# Patient Record
Sex: Male | Born: 1975 | Race: White | Hispanic: No | Marital: Married | Smoking: Never smoker
Health system: Southern US, Community
[De-identification: ages and names within clinical notes are randomized; demographics above are authoritative.]

## PROBLEM LIST (undated history)

## (undated) DIAGNOSIS — F32A Depression, unspecified: Secondary | ICD-10-CM

## (undated) DIAGNOSIS — G43909 Migraine, unspecified, not intractable, without status migrainosus: Secondary | ICD-10-CM

## (undated) DIAGNOSIS — F329 Major depressive disorder, single episode, unspecified: Secondary | ICD-10-CM

## (undated) DIAGNOSIS — G4730 Sleep apnea, unspecified: Secondary | ICD-10-CM

## (undated) HISTORY — DX: Depression, unspecified: F32.A

## (undated) HISTORY — DX: Sleep apnea, unspecified: G47.30

## (undated) HISTORY — DX: Major depressive disorder, single episode, unspecified: F32.9

## (undated) HISTORY — PX: TONSILLECTOMY: SUR1361

## (undated) HISTORY — DX: Migraine, unspecified, not intractable, without status migrainosus: G43.909

---

## 2014-10-28 ENCOUNTER — Ambulatory Visit (INDEPENDENT_AMBULATORY_CARE_PROVIDER_SITE_OTHER): Payer: Federal, State, Local not specified - PPO | Admitting: Emergency Medicine

## 2014-10-28 VITALS — BP 112/68 | HR 69 | Temp 98.4°F | Resp 18 | Ht 67.0 in | Wt 194.0 lb

## 2014-10-28 DIAGNOSIS — J029 Acute pharyngitis, unspecified: Secondary | ICD-10-CM | POA: Diagnosis not present

## 2014-10-28 MED ORDER — CEFACLOR 250 MG PO CAPS: 250.0000 mg | ORAL_CAPSULE | Freq: Three times a day (TID) | ORAL | Status: DC

## 2014-10-28 NOTE — Progress Notes (Signed)
Subjective:  Patient ID: Christian [name], male    DOB: 12-26-1975  Age: 39 y.o. MRN: [id number]  CC: Sore Throat and Chills   HPI Christian [name] presents  he has urgently clear. He has no fever or chills. He has no wheezing or shortness breath has no cough nausea vomiting or rash. No improvement with over-the-counter medication  History Basir has a past medical history of Depression.   He has no past surgical history on file.   His  family history includes Cancer in his maternal grandmother; Heart disease in his maternal grandfather, paternal grandfather, and paternal grandmother; Mental illness in his maternal grandmother.  He   reports that he has never smoked. He does not have any smokeless tobacco history on file. He reports that he does not drink alcohol or use illicit drugs.  No outpatient prescriptions prior to visit.   No facility-administered medications prior to visit.    Social History   Social History  . Marital Status: Married    Spouse Name: N/A  . Number of Children: N/A  . Years of Education: N/A   Social History Main Topics  . Smoking status: Never Smoker   . Smokeless tobacco: None  . Alcohol Use: No  . Drug Use: No  . Sexual Activity: Not Asked   Other Topics Concern  . None   Social History Narrative  . None     Review of Systems  Constitutional: Positive for chills. Negative for fever and appetite change.  HENT: Positive for congestion and sore throat. Negative for ear pain, postnasal drip and sinus pressure.   Eyes: Negative for pain and redness.  Respiratory: Negative for cough, shortness of breath and wheezing.   Cardiovascular: Negative for leg swelling.  Gastrointestinal: Negative for nausea, vomiting, abdominal pain, diarrhea, constipation and blood in stool.  Endocrine: Negative for polyuria.  Genitourinary: Negative for dysuria, urgency, frequency and flank pain.  Musculoskeletal: Negative for gait problem.  Skin: Negative for  rash.  Neurological: Negative for weakness and headaches.  Psychiatric/Behavioral: Negative for confusion and decreased concentration. The patient is not nervous/anxious.     Objective:  BP 112/68 mmHg  Pulse 69  Temp(Src) 98.4 F (36.9 C) (Oral)  Resp 18  Ht  (1.702 m)  Wt 194 lb (87.998 kg)  BMI 30.38 kg/m2  SpO2 98%  Physical Exam  Constitutional: He is oriented to person, place, and time. He appears well-developed and well-nourished. No distress.  HENT:  Head: Normocephalic and atraumatic.  Right Ear: External ear normal.  Left Ear: External ear normal.  Nose: Nose normal.  Mouth/Throat: Posterior oropharyngeal edema and posterior oropharyngeal erythema present. No oropharyngeal exudate or tonsillar abscesses.  Eyes: Conjunctivae and EOM are normal. Pupils are equal, round, and reactive to light. No scleral icterus.  Neck: Normal range of motion. Neck supple. No tracheal deviation present.  Cardiovascular: Normal rate, regular rhythm and normal heart sounds.   Pulmonary/Chest: Effort normal. No respiratory distress. He has no wheezes. He has no rales.  Abdominal: He exhibits no mass. There is no tenderness. There is no rebound and no guarding.  Musculoskeletal: He exhibits no edema.  Lymphadenopathy:    He has no cervical adenopathy.  Neurological: He is alert and oriented to person, place, and time. Coordination normal.  Skin: Skin is warm and dry. No rash noted.  Psychiatric: He has a normal mood and affect. His behavior is normal.      Assessment & Plan:   Mohsin was seen today  for sore throat and chills.  Diagnoses and all orders for this visit:  Acute pharyngitis, unspecified etiology  Other orders -     cefaclor (CECLOR) 250 MG capsule; Take 1 capsule (250 mg total) by mouth 3 (three) times daily.  I am having [name] start on cefaclor. I am also having him maintain his buPROPion, PARoxetine, and prazosin.  Meds ordered this encounter    Medications  . buPROPion (WELLBUTRIN SR) 150 MG 12 hr tablet    Sig: Take 150 mg by mouth 2 (two) times daily.  Marland Kitchen PARoxetine (PAXIL) 40 MG tablet    Sig: Take 40 mg by mouth every morning.  . prazosin (MINIPRESS) 1 MG capsule    Sig: Take 1 mg by mouth at bedtime.  . cefaclor (CECLOR) 250 MG capsule    Sig: Take 1 capsule (250 mg total) by mouth 3 (three) times daily.    Dispense:  20 capsule    Refill:  0    Appropriate red flag conditions were discussed with the patient as well as actions that should be taken.  Patient expressed his understanding.  Follow-up: Return if symptoms worsen or fail to improve.  Carmelina Dane, MD

## 2014-10-28 NOTE — Patient Instructions (Signed)

## 2015-03-11 DIAGNOSIS — F32A Depression, unspecified: Secondary | ICD-10-CM | POA: Insufficient documentation

## 2015-03-11 DIAGNOSIS — F329 Major depressive disorder, single episode, unspecified: Secondary | ICD-10-CM | POA: Insufficient documentation

## 2016-01-06 DIAGNOSIS — Z880 Allergy status to penicillin: Secondary | ICD-10-CM | POA: Diagnosis not present

## 2016-01-06 DIAGNOSIS — Z79899 Other long term (current) drug therapy: Secondary | ICD-10-CM | POA: Diagnosis not present

## 2016-01-06 DIAGNOSIS — R0602 Shortness of breath: Secondary | ICD-10-CM | POA: Diagnosis not present

## 2016-08-17 DIAGNOSIS — G4733 Obstructive sleep apnea (adult) (pediatric): Secondary | ICD-10-CM | POA: Diagnosis not present

## 2017-03-25 ENCOUNTER — Encounter: Payer: Self-pay | Admitting: Physician Assistant

## 2017-03-25 ENCOUNTER — Ambulatory Visit: Payer: Federal, State, Local not specified - PPO | Admitting: Physician Assistant

## 2017-03-25 ENCOUNTER — Other Ambulatory Visit: Payer: Self-pay

## 2017-03-25 VITALS — BP 102/70 | HR 71 | Temp 98.1°F | Resp 18 | Ht 67.0 in | Wt 185.0 lb

## 2017-03-25 DIAGNOSIS — Z1329 Encounter for screening for other suspected endocrine disorder: Secondary | ICD-10-CM

## 2017-03-25 DIAGNOSIS — Z130 Encounter for screening for diseases of the blood and blood-forming organs and certain disorders involving the immune mechanism: Secondary | ICD-10-CM

## 2017-03-25 DIAGNOSIS — Z1321 Encounter for screening for nutritional disorder: Secondary | ICD-10-CM

## 2017-03-25 DIAGNOSIS — Z113 Encounter for screening for infections with a predominantly sexual mode of transmission: Secondary | ICD-10-CM | POA: Diagnosis not present

## 2017-03-25 DIAGNOSIS — Z0000 Encounter for general adult medical examination without abnormal findings: Secondary | ICD-10-CM

## 2017-03-25 DIAGNOSIS — Z114 Encounter for screening for human immunodeficiency virus [HIV]: Secondary | ICD-10-CM

## 2017-03-25 DIAGNOSIS — Z13228 Encounter for screening for other metabolic disorders: Secondary | ICD-10-CM

## 2017-03-25 NOTE — Patient Instructions (Signed)
     IF you received an x-ray today, you will receive an invoice from [facility]. Please contact  Radiology at [phone] with questions or concerns regarding your invoice.   IF you received labwork today, you will receive an invoice from [facility]. Please contact [facility] at [phone] with questions or concerns regarding your invoice.   Our billing staff will not be able to assist you with questions regarding bills from these companies.  You will be contacted with the lab results as soon as they are available. The fastest way to get your results is to activate your My Chart account. Instructions are located on the last page of this paperwork. If you have not heard from us regarding the results in 2 weeks, please contact this office.     

## 2017-03-25 NOTE — Progress Notes (Signed)
03/25/2017 12:08 PM   DOB: February 06, 1975 / MRN: [id number]  SUBJECTIVE:  Christian [name] is a 42 y.o. male presenting for annual exam. He denies a family history of prostate cancer, colon cancer, sudden cardiac death.  He does have a family history of CAD but no early death. He has not been screened diabetes. He is never smoker and denies marijuana.  He does not drink.  He has a history of PTSD well controlled by by the psychiatry in the [place].     He is allergic to penicillins.   He  has a past medical history of Depression.    He  reports that  has never smoked. His smokeless tobacco use includes snuff. He reports that he does not drink alcohol or use drugs. He  has no sexual activity history on file. The patient  has no past surgical history on file.  His family history includes Cancer in his maternal grandmother; Heart disease in his maternal grandfather, paternal grandfather, and paternal grandmother; Mental illness in his maternal grandmother.  Review of Systems  Constitutional: Negative for chills, diaphoresis and fever.  Eyes: Negative.   Respiratory: Negative for cough, hemoptysis, sputum production, shortness of breath and wheezing.   Cardiovascular: Negative for chest pain, orthopnea and leg swelling.  Gastrointestinal: Negative for abdominal pain, blood in stool, constipation, diarrhea, heartburn, melena, nausea and vomiting.  Genitourinary: Negative for dysuria, flank pain, frequency, hematuria and urgency.  Skin: Negative for rash.  Neurological: Negative for dizziness, sensory change, speech change, focal weakness and headaches.    The problem list and medications were reviewed and updated by myself where necessary and exist elsewhere in the encounter.   OBJECTIVE:  BP 102/70   Pulse 71   Temp 98.1 F (36.7 C) (Oral)   Resp 18   Ht 5\' 7"  (1.702 m)   Wt 185 lb (83.9 kg)   SpO2 98%   BMI 28.98 kg/m   Physical Exam  Constitutional: He appears well-developed. He is  active and cooperative.  Non-toxic appearance.  HENT:  Right Ear: External ear normal.  Left Ear: External ear normal.  Mouth/Throat: Oropharynx is clear and moist. No oropharyngeal exudate.  Cardiovascular: Normal rate, regular rhythm, S1 normal, S2 normal, normal heart sounds, intact distal pulses and normal pulses. Exam reveals no gallop and no friction rub.  No murmur heard. Pulmonary/Chest: Effort normal. No stridor. No tachypnea. No respiratory distress. He has no wheezes. He has no rales.  Abdominal: He exhibits no distension.  Musculoskeletal: He exhibits no edema.  Neurological: He is alert.  Skin: Skin is warm and dry. He is not diaphoretic. No pallor.  Vitals reviewed.   No results found for this or any previous visit (from the past 72 hour(s)).  No results found.  ASSESSMENT AND PLAN:  Christian [name] was seen today for lab work.  Diagnoses and all orders for this visit:  Annual physical exam: 42 year old male with a long history of dipping here today for annual exam.  Screenings out.  His exam is normal.  No concerning lesions in the mouth.  Screening for HIV (human immunodeficiency virus) -     HIV antibody  Screening examination for STD (sexually transmitted disease) -     GC/Chlamydia Probe Amp  Screening for endocrine, nutritional, metabolic and immunity disorder -     CBC -     Lipid panel -     TSH -     Hemoglobin A1c -     Hepatitis C  antibody -     Hepatitis B surface antibody -     Hepatitis B surface antigen -     Basic metabolic panel -     Hepatic function panel    The patient is advised to call or return to clinic if he does not see an improvement in symptoms, or to seek the care of the closest emergency department if he worsens with the above plan.   Deliah BostonMichael Lior [name], MHS, PA-C [facility] 03/25/2017 12:08 PM

## 2017-03-26 LAB — CBC
HEMOGLOBIN: 15.7 g/dL (ref 13.0–17.7)
Hematocrit: 44.7 % (ref 37.5–51.0)
MCH: 30 pg (ref 26.6–33.0)
MCHC: 35.1 g/dL (ref 31.5–35.7)
MCV: 85 fL (ref 79–97)
PLATELETS: 209 10*3/uL (ref 150–379)
RBC: 5.24 x10E6/uL (ref 4.14–5.80)
RDW: 13.9 % (ref 12.3–15.4)
WBC: 6.5 10*3/uL (ref 3.4–10.8)

## 2017-03-26 LAB — LIPID PANEL
CHOLESTEROL TOTAL: 210 mg/dL — AB (ref 100–199)
Chol/HDL Ratio: 5.7 ratio — ABNORMAL HIGH (ref 0.0–5.0)
HDL: 37 mg/dL — ABNORMAL LOW (ref >39)
LDL CALC: 121 mg/dL — AB (ref 0–99)
TRIGLYCERIDES: 262 mg/dL — AB (ref 0–149)
VLDL Cholesterol Cal: 52 mg/dL — ABNORMAL HIGH (ref 5–40)

## 2017-03-26 LAB — BASIC METABOLIC PANEL
BUN/Creatinine Ratio: 9 (ref 9–20)
BUN: 10 mg/dL (ref 6–20)
CO2: 26 mmol/L (ref 20–29)
CREATININE: 1.06 mg/dL (ref 0.76–1.27)
Calcium: 10.4 mg/dL — ABNORMAL HIGH (ref 8.7–10.2)
Chloride: 100 mmol/L (ref 96–106)
GFR, EST AFRICAN AMERICAN: 102 mL/min/{1.73_m2} (ref >59)
GFR, EST NON AFRICAN AMERICAN: 88 mL/min/{1.73_m2} (ref >59)
Glucose: 86 mg/dL (ref 65–99)
Potassium: 5 mmol/L (ref 3.5–5.2)
Sodium: 140 mmol/L (ref 134–144)

## 2017-03-26 LAB — HEPATIC FUNCTION PANEL
ALK PHOS: 75 IU/L (ref 39–117)
ALT: 23 IU/L (ref 0–44)
AST: 19 IU/L (ref 0–40)
Albumin: 5.1 g/dL (ref 3.5–5.5)
BILIRUBIN, DIRECT: 0.1 mg/dL (ref 0.00–0.40)
Bilirubin Total: 0.4 mg/dL (ref 0.0–1.2)
Total Protein: 7.4 g/dL (ref 6.0–8.5)

## 2017-03-26 LAB — HEPATITIS B SURFACE ANTIGEN: HEP B S AG: NEGATIVE

## 2017-03-26 LAB — HEPATITIS B SURFACE ANTIBODY, QUANTITATIVE: Hepatitis B Surf Ab Quant: 4.6 m[IU]/mL — ABNORMAL LOW

## 2017-03-26 LAB — LDL CHOLESTEROL, DIRECT: LDL Direct: 146 mg/dL — ABNORMAL HIGH (ref 0–99)

## 2017-03-26 LAB — TSH: TSH: 1.51 u[IU]/mL (ref 0.450–4.500)

## 2017-03-26 LAB — HIV ANTIBODY (ROUTINE TESTING W REFLEX): HIV SCREEN 4TH GENERATION: NONREACTIVE

## 2017-03-26 LAB — HEMOGLOBIN A1C
ESTIMATED AVERAGE GLUCOSE: 97 mg/dL
HEMOGLOBIN A1C: 5 % (ref 4.8–5.6)

## 2017-03-26 LAB — HEPATITIS C ANTIBODY: Hep C Virus Ab: <0.1 {s_co_ratio} (ref 0.0–0.9)

## 2017-03-28 LAB — GC/CHLAMYDIA PROBE AMP
CHLAMYDIA, DNA PROBE: NEGATIVE
NEISSERIA GONORRHOEAE BY PCR: NEGATIVE

## 2017-08-05 ENCOUNTER — Encounter: Payer: Self-pay | Admitting: Emergency Medicine

## 2017-08-05 ENCOUNTER — Other Ambulatory Visit: Payer: Self-pay

## 2017-08-05 ENCOUNTER — Ambulatory Visit: Payer: Federal, State, Local not specified - PPO | Admitting: Emergency Medicine

## 2017-08-05 VITALS — BP 112/77 | HR 67 | Temp 98.3°F | Resp 16 | Ht 67.0 in | Wt 184.4 lb

## 2017-08-05 DIAGNOSIS — M5416 Radiculopathy, lumbar region: Secondary | ICD-10-CM

## 2017-08-05 MED ORDER — PREDNISONE 20 MG PO TABS: 40.0000 mg | ORAL_TABLET | Freq: Every day | ORAL | 0 refills | Status: AC

## 2017-08-05 NOTE — Progress Notes (Signed)
Christian [name] 42 y.o.   Chief Complaint  Patient presents with  . back pain radiating into left thigh since tuesday 08/01/17    pain in left side intermittent, burning sensation in right groin area and top of thigh, pain level 8/10. ,motrin for pain but no motrin today.  Per pt the burning sensation is so bad it hurts for clothes to touch the skin.  No c/o dysuria    HISTORY OF PRESENT ILLNESS: This is a 42 y.o. male complaining of burning pain to his right thigh area that starts in the right lumbar area that started several days ago.  States skin is very sensitive.  Denies a rash.  Denies injury although he was installing car stereo recently and thinks he strained his low back.  Denies bladder or bowel symptoms.  Fully ambulatory without difficulty.  No other significant symptoms.  HPI   Prior to Admission medications   Medication Sig Start Date End Date Taking? Authorizing Provider  buPROPion (WELLBUTRIN SR) 150 MG 12 hr tablet Take 150 mg by mouth 2 (two) times daily.   Yes [provider]  PARoxetine (PAXIL) 40 MG tablet Take 40 mg by mouth every morning.   Yes [provider]  prazosin (MINIPRESS) 1 MG capsule Take 1 mg by mouth at bedtime.   Yes [provider]  predniSONE (DELTASONE) 20 MG tablet Take 2 tablets (40 mg total) by mouth daily with breakfast for 5 days. 08/05/17 08/10/17  Georgina Quint, MD    Allergies  Allergen Reactions  . Penicillins Hives and Shortness Of Breath    There are no active problems to display for this patient.   Past Medical History:  Diagnosis Date  . Depression     No past surgical history on file.  Social History   Socioeconomic History  . Marital status: Married    Spouse name: Not on file  . Number of children: Not on file  . Years of education: Not on file  . Highest education level: Not on file  Occupational History  . Not on file  Social Needs  . Financial resource strain: Not on file  . Food  insecurity:    Worry: Not on file    Inability: Not on file  . Transportation needs:    Medical: Not on file    Non-medical: Not on file  Tobacco Use  . Smoking status: Never Smoker  . Smokeless tobacco: Current User    Types: Snuff  Substance and Sexual Activity  . Alcohol use: No    Alcohol/week: 0.0 oz  . Drug use: No  . Sexual activity: Not on file  Lifestyle  . Physical activity:    Days per week: Not on file    Minutes per session: Not on file  . Stress: Not on file  Relationships  . Social connections:    Talks on phone: Not on file    Gets together: Not on file    Attends religious service: Not on file    Active member of club or organization: Not on file    Attends meetings of clubs or organizations: Not on file    Relationship status: Not on file  . Intimate partner violence:    Fear of current or ex partner: Not on file    Emotionally abused: Not on file    Physically abused: Not on file    Forced sexual activity: Not on file  Other Topics Concern  . Not on file  Social History Narrative  . Not on file    Family History  Problem Relation Age of Onset  . Cancer Maternal Grandmother   . Mental illness Maternal Grandmother   . Heart disease Maternal Grandfather   . Heart disease Paternal Grandmother   . Heart disease Paternal Grandfather      Review of Systems  Constitutional: Negative.  Negative for chills, fever and malaise/fatigue.  HENT: Negative.  Negative for congestion, sinus pain and sore throat.   Eyes: Negative.   Respiratory: Negative.  Negative for cough and shortness of breath.   Cardiovascular: Negative.  Negative for chest pain and palpitations.  Gastrointestinal: Negative.  Negative for abdominal pain, blood in stool, diarrhea, nausea and vomiting.  Genitourinary: Negative.  Negative for dysuria and hematuria.  Musculoskeletal: Positive for back pain.  Skin: Negative.  Negative for rash.  Neurological: Negative for dizziness, sensory  change, focal weakness and headaches.  Endo/Heme/Allergies: Negative.   All other systems reviewed and are negative.   Vitals:   08/05/17 1049  BP: 112/77  Pulse: 67  Resp: 16  Temp: 98.3 F (36.8 C)  SpO2: 98%    Physical Exam  Constitutional: He is oriented to person, place, and time. He appears well-developed and well-nourished.  HENT:  Head: Normocephalic and atraumatic.  Eyes: Pupils are equal, round, and reactive to light. EOM are normal.  Neck: Normal range of motion. Neck supple.  Cardiovascular: Normal rate, regular rhythm and normal heart sounds.  Pulmonary/Chest: Effort normal and breath sounds normal.  Abdominal: Soft. Bowel sounds are normal. He exhibits no distension. There is no tenderness.  Musculoskeletal: He exhibits no edema or tenderness.       Lumbar back: He exhibits decreased range of motion. He exhibits no bony tenderness, no pain, no spasm and normal pulse.       Back:  Lower extremities: Within normal limits.  Normal sensation and normal strength.  NVI.  Neurological: He is alert and oriented to person, place, and time. He displays normal reflexes. No sensory deficit. He exhibits normal muscle tone. Coordination normal.  Skin: Skin is warm and dry. Capillary refill takes less than 2 seconds. No rash noted.  Psychiatric: He has a normal mood and affect. His behavior is normal.  Vitals reviewed.  A total of 25 minutes was spent in the room with the patient, greater than 50% of which was in counseling/coordination of care regarding differential diagnosis, treatment, need for diagnostic imaging and possible orthopedic follow-up.   ASSESSMENT & PLAN: Christian [name] was seen today for back pain radiating into left thigh since tuesday 08/01/17.  Diagnoses and all orders for this visit:  Lumbar back pain with radiculopathy affecting right lower extremity -     MR Lumbar Spine Wo Contrast; Future -     predniSONE (DELTASONE) 20 MG tablet; Take 2 tablets (40 mg  total) by mouth daily with breakfast for 5 days. -     Ambulatory referral to Orthopedic Surgery    Patient Instructions       IF you received an x-ray today, you will receive an invoice from [facility]. Please contact [facility] at [phone] with questions or concerns regarding your invoice.   IF you received labwork today, you will receive an invoice from [facility]. Please contact [facility] at [phone] with questions or concerns regarding your invoice.   Our billing staff will not be able to assist you with questions regarding bills from these companies.  You will be contacted with the lab  results as soon as they are available. The fastest way to get your results is to activate your My Chart account. Instructions are located on the last page of this paperwork. If you have not heard from us regarding the results in 2 weeks, please contact this office.     Sciatica Sciatica is pain, numbness, weakness, or tingling along your sciatic nerve. The sciatic nerve starts in the lower back and goes down the back of each leg. Sciatica happens when this nerve is pinched or has pressure put on it. Sciatica usually goes away on its own or with treatment. Sometimes, sciatica may keep coming back (recur). Follow these instructions at home: Medicines  Take over-the-counter and prescription medicines only as told by your doctor.  Do not drive or use heavy machinery while taking prescription pain medicine. Managing pain  If directed, put ice on the affected area. ? Put ice in a plastic bag. ? Place a towel between your skin and the bag. ? Leave the ice on for 20 minutes, 2-3 times a day.  After icing, apply heat to the affected area before you exercise or as often as told by your doctor. Use the heat source that your doctor tells you to use, such as a moist heat pack or a heating pad. ? Place a towel between your skin and the heat source. ? Leave the heat on for 20-30  minutes. ? Remove the heat if your skin turns bright red. This is especially important if you are unable to feel pain, heat, or cold. You may have a greater risk of getting burned. Activity  Return to your normal activities as told by your doctor. Ask your doctor what activities are safe for you. ? Avoid activities that make your sciatica worse.  Take short rests during the day. Rest in a lying or standing position. This is usually better than sitting to rest. ? When you rest for a long time, do some physical activity or stretching between periods of rest. ? Avoid sitting for a long time without moving. Get up and move around at least one time each hour.  Exercise and stretch regularly, as told by your doctor.  Do not lift anything that is heavier than 10 lb (4.5 kg) while you have symptoms of sciatica. ? Avoid lifting heavy things even when you do not have symptoms. ? Avoid lifting heavy things over and over.  When you lift objects, always lift in a way that is safe for your body. To do this, you should: ? Bend your knees. ? Keep the object close to your body. ? Avoid twisting. General instructions  Use good posture. ? Avoid leaning forward when you are sitting. ? Avoid hunching over when you are standing.  Stay at a healthy weight.  Wear comfortable shoes that support your feet. Avoid wearing high heels.  Avoid sleeping on a mattress that is too soft or too hard. You might have less pain if you sleep on a mattress that is firm enough to support your back.  Keep all follow-up visits as told by your doctor. This is important. Contact a doctor if:  You have pain that: ? Wakes you up when you are sleeping. ? Gets worse when you lie down. ? Is worse than the pain you have had in the past. ? Lasts longer than 4 weeks.  You lose weight for without trying. Get help right away if:  You cannot control when you pee (urinate) or poop (have a  bowel movement).  You have weakness in  any of these areas and it gets worse. ? Lower back. ? Lower belly (pelvis). ? Butt (buttocks). ? Legs.  You have redness or swelling of your back.  You have a burning feeling when you pee. This information is not intended to replace advice given to you by your health care provider. Make sure you discuss any questions you have with your health care provider. Document Released: 10/20/2007 Document Revised: 06/18/2015 Document Reviewed: 09/19/2014 Elsevier Interactive Patient Education  2018 Elsevier Inc.      Edwina Barth, MD [facility]

## 2017-08-05 NOTE — Patient Instructions (Addendum)
   IF you received an x-ray today, you will receive an invoice from [facility]. Please contact [facility] at [phone] with questions or concerns regarding your invoice.   IF you received labwork today, you will receive an invoice from [facility]. Please contact [facility] at [phone] with questions or concerns regarding your invoice.   Our billing staff will not be able to assist you with questions regarding bills from these companies.  You will be contacted with the lab results as soon as they are available. The fastest way to get your results is to activate your My Chart account. Instructions are located on the last page of this paperwork. If you have not heard from us regarding the results in 2 weeks, please contact this office.     Sciatica Sciatica is pain, numbness, weakness, or tingling along your sciatic nerve. The sciatic nerve starts in the lower back and goes down the back of each leg. Sciatica happens when this nerve is pinched or has pressure put on it. Sciatica usually goes away on its own or with treatment. Sometimes, sciatica may keep coming back (recur). Follow these instructions at home: Medicines  Take over-the-counter and prescription medicines only as told by your doctor.  Do not drive or use heavy machinery while taking prescription pain medicine. Managing pain  If directed, put ice on the affected area. ? Put ice in a plastic bag. ? Place a towel between your skin and the bag. ? Leave the ice on for 20 minutes, 2-3 times a day.  After icing, apply heat to the affected area before you exercise or as often as told by your doctor. Use the heat source that your doctor tells you to use, such as a moist heat pack or a heating pad. ? Place a towel between your skin and the heat source. ? Leave the heat on for 20-30 minutes. ? Remove the heat if your skin turns bright red. This is especially important if you are unable to feel pain, heat, or  cold. You may have a greater risk of getting burned. Activity  Return to your normal activities as told by your doctor. Ask your doctor what activities are safe for you. ? Avoid activities that make your sciatica worse.  Take short rests during the day. Rest in a lying or standing position. This is usually better than sitting to rest. ? When you rest for a long time, do some physical activity or stretching between periods of rest. ? Avoid sitting for a long time without moving. Get up and move around at least one time each hour.  Exercise and stretch regularly, as told by your doctor.  Do not lift anything that is heavier than 10 lb (4.5 kg) while you have symptoms of sciatica. ? Avoid lifting heavy things even when you do not have symptoms. ? Avoid lifting heavy things over and over.  When you lift objects, always lift in a way that is safe for your body. To do this, you should: ? Bend your knees. ? Keep the object close to your body. ? Avoid twisting. General instructions  Use good posture. ? Avoid leaning forward when you are sitting. ? Avoid hunching over when you are standing.  Stay at a healthy weight.  Wear comfortable shoes that support your feet. Avoid wearing high heels.  Avoid sleeping on a mattress that is too soft or too hard. You might have less pain if you sleep on a mattress that is firm enough   to support your back.  Keep all follow-up visits as told by your doctor. This is important. Contact a doctor if:  You have pain that: ? Wakes you up when you are sleeping. ? Gets worse when you lie down. ? Is worse than the pain you have had in the past. ? Lasts longer than 4 weeks.  You lose weight for without trying. Get help right away if:  You cannot control when you pee (urinate) or poop (have a bowel movement).  You have weakness in any of these areas and it gets worse. ? Lower back. ? Lower belly (pelvis). ? Butt (buttocks). ? Legs.  You have redness  or swelling of your back.  You have a burning feeling when you pee. This information is not intended to replace advice given to you by your health care provider. Make sure you discuss any questions you have with your health care provider. Document Released: 10/20/2007 Document Revised: 06/18/2015 Document Reviewed: 09/19/2014 Elsevier Interactive Patient Education  2018 Elsevier Inc.  

## 2017-08-09 ENCOUNTER — Encounter: Payer: Self-pay | Admitting: Emergency Medicine

## 2017-08-17 ENCOUNTER — Ambulatory Visit (HOSPITAL_COMMUNITY)
Admission: RE | Admit: 2017-08-17 | Discharge: 2017-08-17 | Disposition: A | Discharge status: Home / Self Care | Payer: Federal, State, Local not specified - PPO | Source: Ambulatory Visit | Attending: Emergency Medicine | Admitting: Emergency Medicine

## 2017-08-17 ENCOUNTER — Encounter: Payer: Self-pay | Admitting: Emergency Medicine

## 2017-08-17 DIAGNOSIS — M545 Low back pain: Secondary | ICD-10-CM | POA: Diagnosis not present

## 2017-08-17 DIAGNOSIS — M5416 Radiculopathy, lumbar region: Secondary | ICD-10-CM

## 2017-08-18 ENCOUNTER — Encounter: Payer: Self-pay | Admitting: Radiology

## 2017-08-18 ENCOUNTER — Encounter: Payer: Self-pay | Admitting: Emergency Medicine

## 2017-08-23 ENCOUNTER — Other Ambulatory Visit: Payer: Self-pay | Admitting: Emergency Medicine

## 2017-08-23 NOTE — Telephone Encounter (Signed)
Recommend to take combination of Tylenol and Advil around-the-clock as needed.  He was also referred to orthopedics.  He needs to follow-up with them.  Thanks.

## 2017-09-12 DIAGNOSIS — G4733 Obstructive sleep apnea (adult) (pediatric): Secondary | ICD-10-CM | POA: Diagnosis not present

## 2017-09-22 ENCOUNTER — Ambulatory Visit (INDEPENDENT_AMBULATORY_CARE_PROVIDER_SITE_OTHER): Payer: Federal, State, Local not specified - PPO | Admitting: Orthopaedic Surgery

## 2017-09-22 ENCOUNTER — Encounter (INDEPENDENT_AMBULATORY_CARE_PROVIDER_SITE_OTHER): Payer: Self-pay | Admitting: Orthopaedic Surgery

## 2017-09-22 VITALS — BP 101/70 | HR 60 | Ht 65.0 in | Wt 183.0 lb

## 2017-09-22 DIAGNOSIS — M545 Low back pain, unspecified: Secondary | ICD-10-CM

## 2017-09-22 DIAGNOSIS — G8929 Other chronic pain: Secondary | ICD-10-CM | POA: Diagnosis not present

## 2017-09-22 NOTE — Progress Notes (Signed)
Office Visit Note   Patient: Christian [name]           Date of Birth: [birth year]           MRN: [id number] Visit Date: 09/22/2017              Requested by: Christian Quint, MD [address] [place], [place] [address] PCP: Patient, No Pcp Per   Assessment & Plan: Visit Diagnoses:  1. Chronic bilateral low back pain without sciatica     Plan: MRI scan demonstrates mild degenerative changes at L4-5.  Suggest exercises, NSAIDs and eventually facet injections if necessary.  Long discussion over 45 minutes regarding all of the above 50% of the time in counseling we will provide with the exercises and see back as needed  Follow-Up Instructions: Return if symptoms worsen or fail to improve.   Orders:  No orders of the defined types were placed in this encounter.  No orders of the defined types were placed in this encounter.     Procedures: No procedures performed   Clinical Data: No additional findings.   Subjective: Chief Complaint  Patient presents with  . New Patient (Initial Visit)    LOW BACK PAIN 20 YRS AGO PULLED IT WHILE PICK UP SOMETHING HEAVY, NO SURGERIES OR INJECTIONS. HAD FLARE UP ABOUT 4 WEEKS AGO FROM PUTTING IN HIS SPEAKERS IN HIS TRUNK  WITH BURNING PAIN IN GROIN AREA SEEN PCP HAD MRI DONE AND REFERRED HERE  [name] is 42 years old and visited the office for evaluation of chronic low back pain.  He notes he has had some trouble with his back for many many years.  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  He realized that over time he was "miserable" and went back to school to learn computers.  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  He had a exacerbation recently when he was installing a car stereo.  Pain is been in the low back radiating to both buttocks and occasionally to his thighs.  No numbness or tingling.  No pain distal to the knees.  He denies any bowel or bladder discomfort.  Not had any  groin pain.  He is been seen by his primary care physician performed a month ago.  The scan demonstrates mild disc degeneration with slight disc bulging at L4-5 associated with mild facet degeneration.  He was negative for stenosis.  The other lumbar levels were negative.  He is seen today for further evaluation.  He is somewhat hesitant to take any medicines.  HPI  Review of Systems  Constitutional: Negative for fatigue and fever.  HENT: Negative for ear pain.   Eyes: Negative for pain.  Respiratory: Negative for cough and shortness of breath.   Cardiovascular: Negative for leg swelling.  Gastrointestinal: Negative for constipation and diarrhea.  Genitourinary: Negative for difficulty urinating.  Musculoskeletal: Positive for back pain. Negative for neck pain.  Skin: Negative for rash.  Allergic/Immunologic: Negative for food allergies.  Neurological: Positive for weakness. Negative for numbness.  Hematological: Does not bruise/bleed easily.  Psychiatric/Behavioral: Negative for sleep disturbance.     Objective: Vital Signs: BP 101/70 (BP Location: Right Arm, Patient Position: Sitting, Cuff Size: Normal)   Pulse 60   Ht 5\' 5"  (1.651 m)   Wt 183 lb (83 kg)   BMI 30.45 kg/m   Physical Exam  Constitutional: He is oriented to person,  place, and time. He appears well-developed and well-nourished.  HENT:  Mouth/Throat: Oropharynx is clear and moist.  Eyes: Pupils are equal, round, and reactive to light. EOM are normal.  Pulmonary/Chest: Effort normal.  Neurological: He is alert and oriented to person, place, and time.  Skin: Skin is warm and dry.  Psychiatric: He has a normal mood and affect. His behavior is normal.    Ortho Exam awake alert and oriented x3.  Comfortable sitting.  Walks without a limp.  Hamstrings seem to be tight.  He lacked about 8 inches to touch the tips of his fingers the tips of his toes.  Some limited back extension.  Had some very mild percussible  tenderness at the lumbosacral junction.  Pelvis level.  Painless range of motion both hips both knees.  No pain in either thigh or either leg.  No distal edema.  Neurovascular exam intact  Specialty Comments:  No specialty comments available.  Imaging: No results found.   PMFS History: Patient Active Problem List   Diagnosis Date Noted  . Lumbar back pain with radiculopathy affecting right lower extremity 08/05/2017   Past Medical History:  Diagnosis Date  . Depression     Family History  Problem Relation Age of Onset  . Cancer Maternal Grandmother   . Mental illness Maternal Grandmother   . Heart disease Maternal Grandfather   . Heart disease Paternal Grandmother   . Heart disease Paternal Grandfather     History reviewed. No pertinent surgical history. Social History   Occupational History  . Not on file  Tobacco Use  . Smoking status: Never Smoker  . Smokeless tobacco: Current User    Types: Snuff  Substance and Sexual Activity  . Alcohol use: No    Alcohol/week: 0.0 standard drinks  . Drug use: No  . Sexual activity: Not on file

## 2017-09-22 NOTE — Patient Instructions (Signed)

## 2018-04-20 ENCOUNTER — Telehealth: Payer: Federal, State, Local not specified - PPO | Admitting: Physician Assistant

## 2018-04-20 ENCOUNTER — Encounter: Payer: Self-pay | Admitting: Physician Assistant

## 2018-04-20 DIAGNOSIS — R42 Dizziness and giddiness: Secondary | ICD-10-CM | POA: Diagnosis not present

## 2018-04-20 DIAGNOSIS — Z880 Allergy status to penicillin: Secondary | ICD-10-CM | POA: Diagnosis not present

## 2018-04-20 DIAGNOSIS — I4510 Unspecified right bundle-branch block: Secondary | ICD-10-CM | POA: Diagnosis not present

## 2018-04-20 DIAGNOSIS — R9431 Abnormal electrocardiogram [ECG] [EKG]: Secondary | ICD-10-CM | POA: Diagnosis not present

## 2018-04-20 DIAGNOSIS — R0602 Shortness of breath: Secondary | ICD-10-CM

## 2018-04-20 DIAGNOSIS — R0789 Other chest pain: Secondary | ICD-10-CM | POA: Diagnosis not present

## 2018-04-20 DIAGNOSIS — F17220 Nicotine dependence, chewing tobacco, uncomplicated: Secondary | ICD-10-CM | POA: Diagnosis not present

## 2018-04-20 NOTE — Progress Notes (Signed)
  E-Visit for [employer] Virus Screening  Based on what you have shared with me, you need to seek an evaluation for a severe illness that is causing your symptoms which may be coronavirus or some other illness.If you are having to use your CPAP in the day, you need to bee seen ASAP. I recommend that you be seen and evaluated "face to face".   If symptoms are manageable currently, go to Urgent Care for Assessment.   I recommend the following:  . If you are having a true medical emergency please call 911. . If you are considered high risk for Corona virus because of a known exposure, fever, shortness of breath and cough, OR if you have severe symptoms of any kind, seek medical care at an emergency room.  . Please call ahead and tell them that you were seen by telemedicine and they have recommended that you have a face to face evaluation. [facility] Emergency Department [address] [place], [place], [place] [address] [phone]  . [facility] Emergency Department [address] [place], [place] [address] [phone]  . [facility] Emergency Department [address] [place], [place], [place] [address] [phone]  . [facility] Emergency Department [address] [place], [place], [place] [address] [phone]  . [facility] Emergency Department [address] [place], [place], [place] [address] [phone]  NOTE: If you entered your credit card information for this eVisit, you will not be charged. You may see a "hold" on your card for the $35 but that hold will drop off and you will not have a charge processed.   Your e-visit answers were reviewed by a board certified advanced clinical practitioner to complete your personal care plan.  Thank you for using e-Visits.

## 2018-06-21 DIAGNOSIS — G4733 Obstructive sleep apnea (adult) (pediatric): Secondary | ICD-10-CM | POA: Diagnosis not present

## 2018-06-21 DIAGNOSIS — Z9119 Patient's noncompliance with other medical treatment and regimen: Secondary | ICD-10-CM | POA: Diagnosis not present

## 2018-07-02 DIAGNOSIS — G4733 Obstructive sleep apnea (adult) (pediatric): Secondary | ICD-10-CM | POA: Diagnosis not present

## 2018-11-13 DIAGNOSIS — F329 Major depressive disorder, single episode, unspecified: Secondary | ICD-10-CM | POA: Diagnosis not present

## 2018-11-13 DIAGNOSIS — Z79899 Other long term (current) drug therapy: Secondary | ICD-10-CM | POA: Diagnosis not present

## 2018-11-13 DIAGNOSIS — Z0189 Encounter for other specified special examinations: Secondary | ICD-10-CM | POA: Diagnosis not present

## 2019-03-26 DIAGNOSIS — Z23 Encounter for immunization: Secondary | ICD-10-CM | POA: Diagnosis not present

## 2019-04-16 DIAGNOSIS — Z23 Encounter for immunization: Secondary | ICD-10-CM | POA: Diagnosis not present

## 2019-05-31 IMAGING — MR MR LUMBAR SPINE W/O CM
4 of 5 series · 19 of 48 positions shown · non-contrast
Comparison: None.

CLINICAL DATA: Low back pain with right leg radiculopathy

EXAM:
MRI LUMBAR SPINE WITHOUT CONTRAST
TECHNIQUE: Multiplanar, multisequence MR imaging of the lumbar spine was
performed. No intravenous contrast was administered.

[Series 3: T1 · sagittal · 4.0mm · 0.53mm/px · 3 of 14 slices shown (1 of 2)]
[im 3/14]
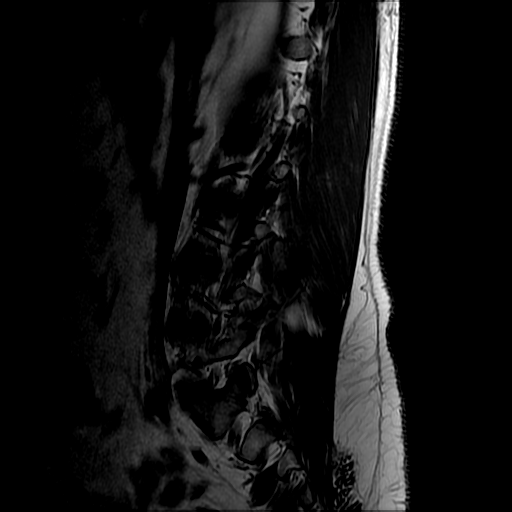
[im 8/14]
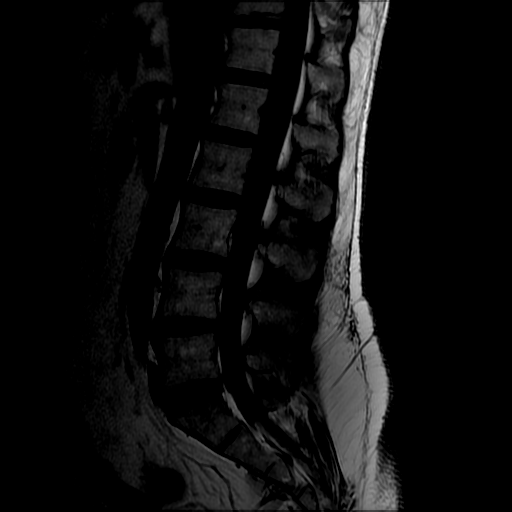
[im 14/14]
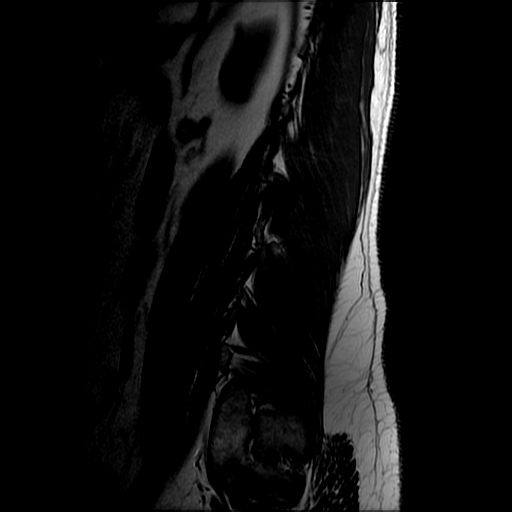

[Series 4: T2 post-contrast · sagittal · 4.0mm · 0.53mm/px · 6 of 14 slices shown]
[im 1/14]
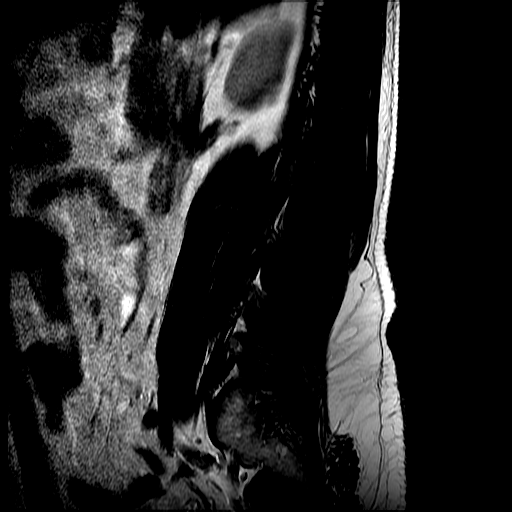
[im 3/14]
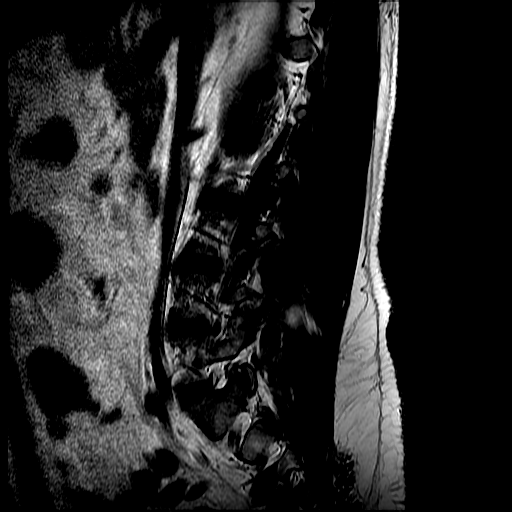
[im 6/14]
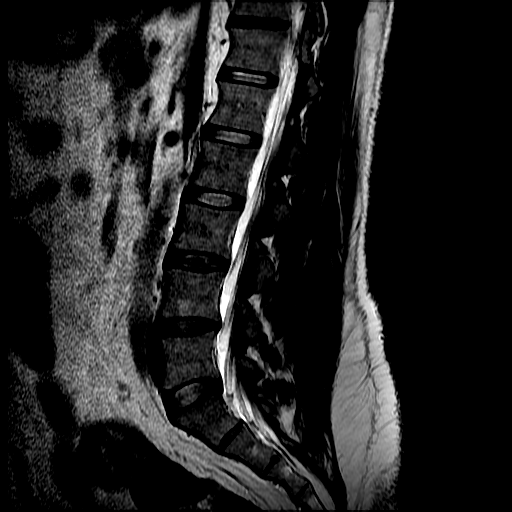
[im 8/14]
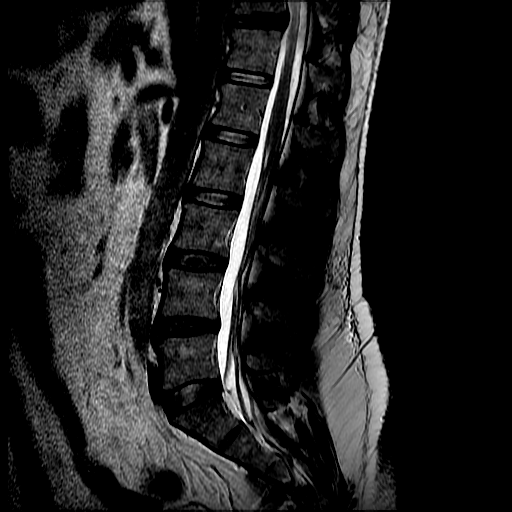
[im 11/14]
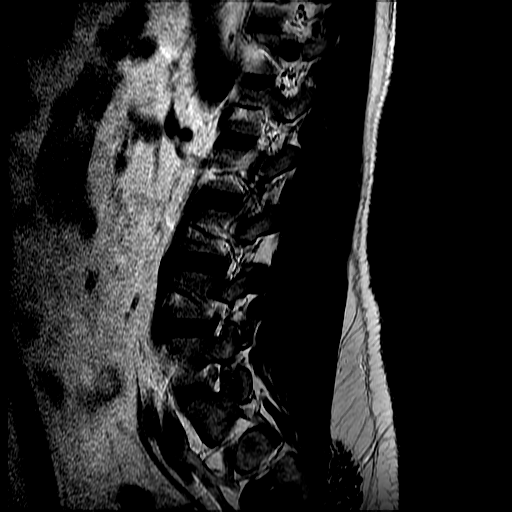
[im 14/14]
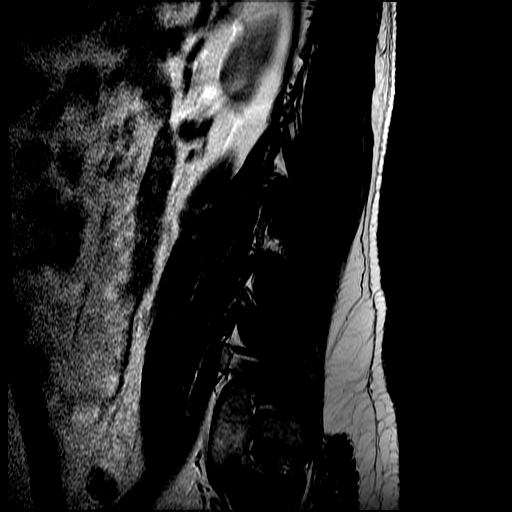

[Series 6: T2 · axial · 4.0mm · 0.39mm/px · z∈[-106,+67]mm · 7 of 37 slices shown]
[im 1/37]
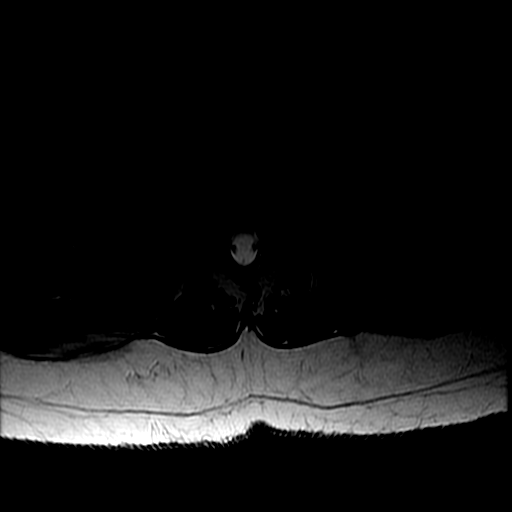
[im 6/37]
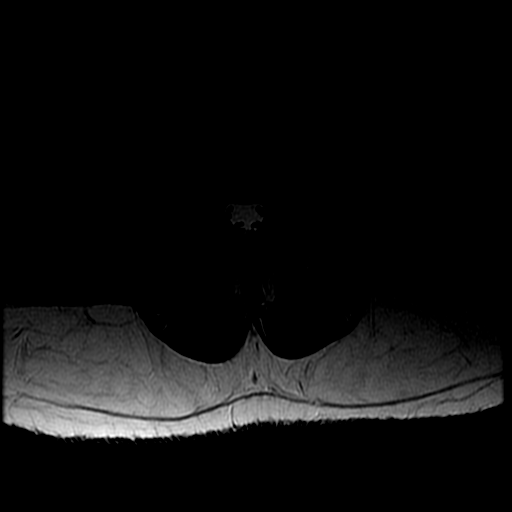
[im 11/37]
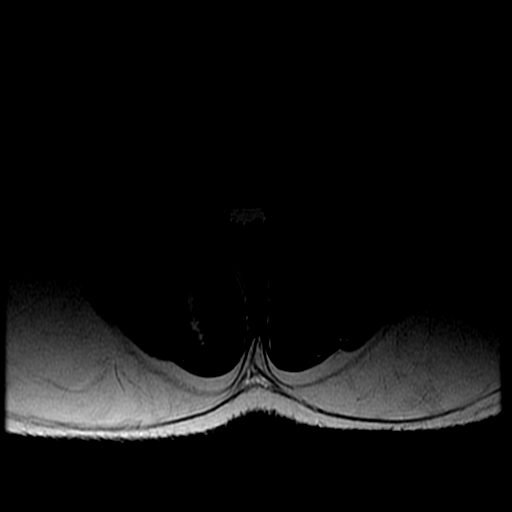
[im 16/37]
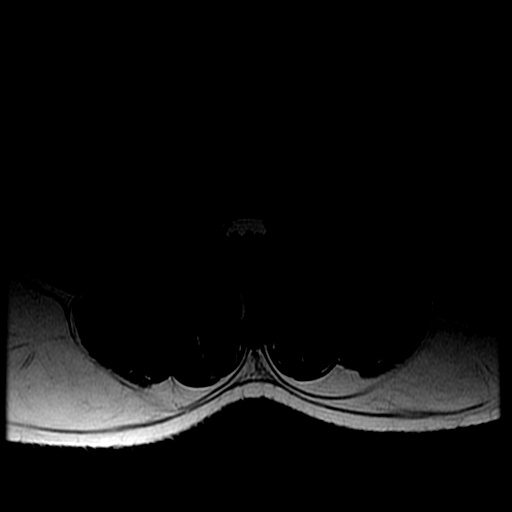
[im 19/37]
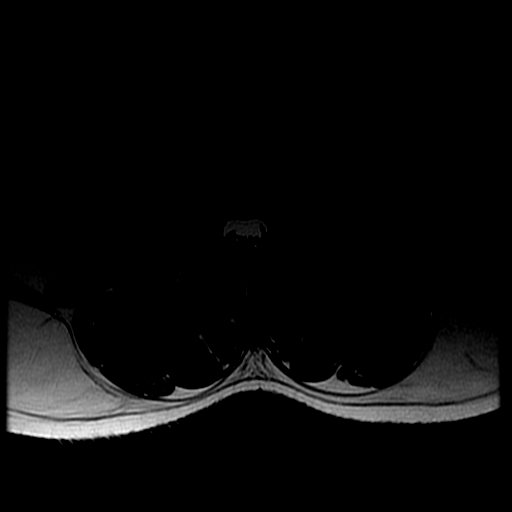
[im 21/37]
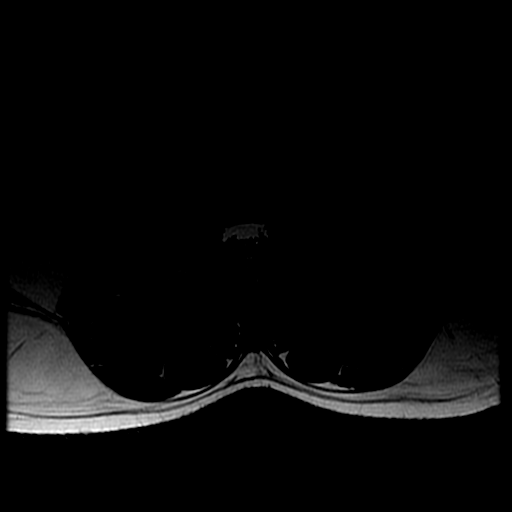
[im 31/37]
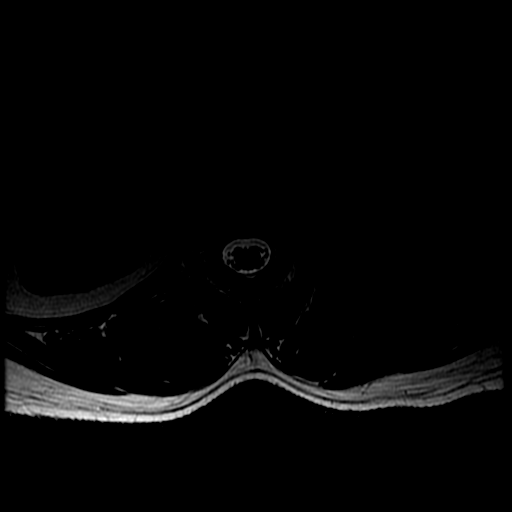

[Series 7: T1 · axial · 4.0mm · 0.39mm/px · z∈[-81,+67]mm · 3 of 37 slices shown (2 of 2)]
[im 6/37]
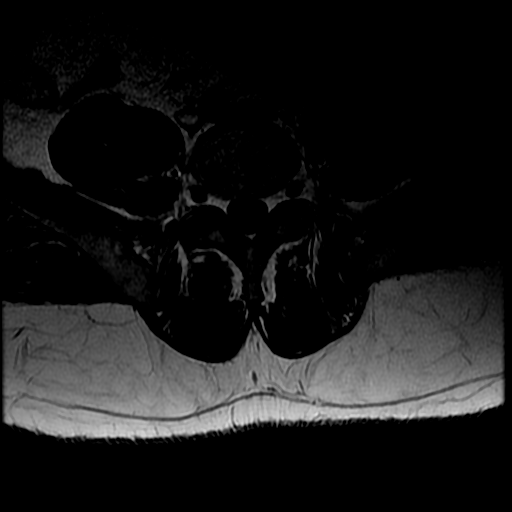
[im 19/37]
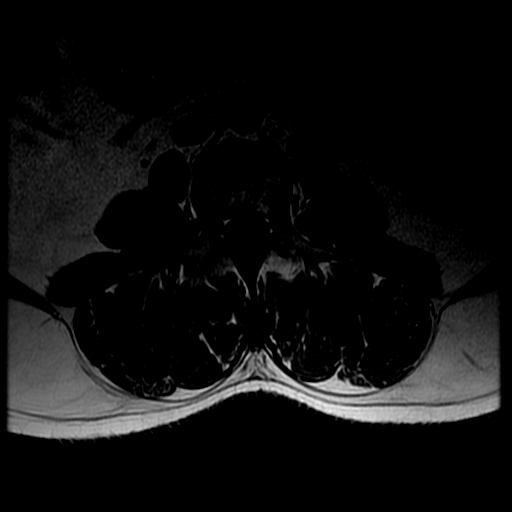
[im 31/37]
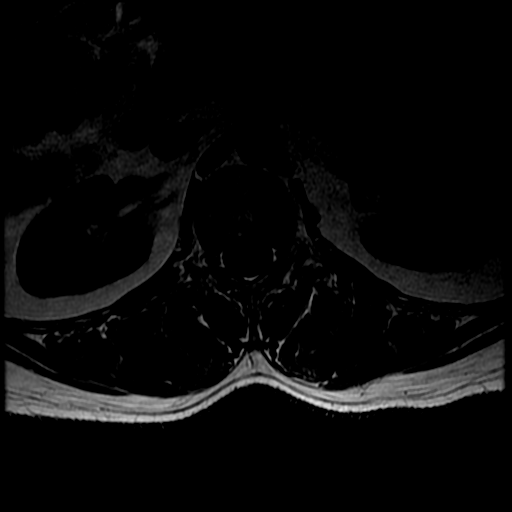

[19 of 48 positions shown; findings below may reference images not displayed]

FINDINGS: Segmentation:  Normal

Alignment:  Normal

Vertebrae: Normal bone marrow. Negative for fracture or mass. No
bone marrow edema

Conus medullaris and cauda equina: Conus extends to the L2-3 level.
Conus and cauda equina appear normal.

Paraspinal and other soft tissues: Negative for paraspinous mass or
soft tissue edema.

Disc levels:

L1-2: Negative

L2-3: Negative

L3-4: Negative

L4-5: Mild disc degeneration with slight disc bulging. Mild facet
degeneration. Negative for stenosis

L5-S1: Negative
IMPRESSION: Mild degenerative change L4-5.

Negative for disc protrusion or spinal stenosis.

## 2019-10-28 DIAGNOSIS — R519 Headache, unspecified: Secondary | ICD-10-CM | POA: Diagnosis not present

## 2019-10-28 DIAGNOSIS — Z23 Encounter for immunization: Secondary | ICD-10-CM | POA: Diagnosis not present

## 2019-11-12 DIAGNOSIS — R519 Headache, unspecified: Secondary | ICD-10-CM | POA: Diagnosis not present

## 2019-11-20 DIAGNOSIS — R519 Headache, unspecified: Secondary | ICD-10-CM | POA: Diagnosis not present

## 2021-01-10 DIAGNOSIS — G4733 Obstructive sleep apnea (adult) (pediatric): Secondary | ICD-10-CM | POA: Diagnosis not present

## 2021-01-19 DIAGNOSIS — M545 Low back pain, unspecified: Secondary | ICD-10-CM | POA: Diagnosis not present

## 2021-01-19 DIAGNOSIS — M25522 Pain in left elbow: Secondary | ICD-10-CM | POA: Diagnosis not present

## 2021-01-19 DIAGNOSIS — M7712 Lateral epicondylitis, left elbow: Secondary | ICD-10-CM | POA: Diagnosis not present

## 2021-01-19 DIAGNOSIS — R52 Pain, unspecified: Secondary | ICD-10-CM | POA: Diagnosis not present

## 2021-01-20 DIAGNOSIS — M47816 Spondylosis without myelopathy or radiculopathy, lumbar region: Secondary | ICD-10-CM | POA: Diagnosis not present

## 2021-01-20 DIAGNOSIS — M545 Low back pain, unspecified: Secondary | ICD-10-CM | POA: Diagnosis not present

## 2021-08-21 DIAGNOSIS — H35033 Hypertensive retinopathy, bilateral: Secondary | ICD-10-CM | POA: Diagnosis not present

## 2021-08-21 DIAGNOSIS — H43313 Vitreous membranes and strands, bilateral: Secondary | ICD-10-CM | POA: Diagnosis not present

## 2021-08-21 DIAGNOSIS — H11153 Pinguecula, bilateral: Secondary | ICD-10-CM | POA: Diagnosis not present

## 2021-10-08 ENCOUNTER — Encounter: Payer: Self-pay | Admitting: Emergency Medicine

## 2021-10-08 ENCOUNTER — Ambulatory Visit
Admission: EM | Admit: 2021-10-08 | Discharge: 2021-10-08 | Disposition: A | Discharge status: Home / Self Care | Payer: Federal, State, Local not specified - PPO | Attending: Family Medicine | Admitting: Family Medicine

## 2021-10-08 DIAGNOSIS — S61211A Laceration without foreign body of left index finger without damage to nail, initial encounter: Secondary | ICD-10-CM | POA: Diagnosis not present

## 2021-10-08 DIAGNOSIS — Z23 Encounter for immunization: Secondary | ICD-10-CM | POA: Diagnosis not present

## 2021-10-08 MED ORDER — TETANUS-DIPHTH-ACELL PERTUSSIS 5-2.5-18.5 LF-MCG/0.5 IM SUSY
0.5000 mL | PREFILLED_SYRINGE | Freq: Once | INTRAMUSCULAR | Status: AC
  Administered 2021-10-08: 0.5 mL via INTRAMUSCULAR

## 2021-10-08 NOTE — ED Provider Notes (Signed)
[facility]    CSN: [id number] Arrival date & time: 10/08/21  1922      History   Chief Complaint Chief Complaint  Patient presents with   Laceration    Index left     HPI Hadden Swaz[place] is a 46 y.o. male.   Patient lacerated his left second finger with the blade of a jig saw today.  He does not remember his last Tdap.  The history is provided by the patient and the spouse.  Laceration Location:  Finger Finger laceration location:  L index finger Length:  1cm Depth:  Cutaneous Quality: straight   Bleeding: controlled   Time since incident:  2 hours Laceration mechanism:  Metal edge Pain details:    Quality:  Aching   Severity:  Mild   Timing:  Constant   Progression:  Improving Foreign body present:  No foreign bodies Relieved by:  None tried Ineffective treatments:  None tried Tetanus status:  Out of date Associated symptoms: no swelling     Past Medical History:  Diagnosis Date   Depression     Patient Active Problem List   Diagnosis Date Noted   Lumbar back pain with radiculopathy affecting right lower extremity 08/05/2017   Depression 03/11/2015    Past Surgical History:  Procedure Laterality Date   TONSILLECTOMY         Home Medications    Prior to Admission medications   Medication Sig Start Date End Date Taking? Authorizing Provider  buPROPion (WELLBUTRIN XL) 300 MG 24 hr tablet TAKE ONE TABLET BY MOUTH IN THE MORNING FOR DEPRESSION 09/01/20  Yes [provider]  buPROPion (WELLBUTRIN SR) 150 MG 12 hr tablet Take 150 mg by mouth 2 (two) times daily. Patient not taking: Reported on 10/08/2021    [provider]  PARoxetine (PAXIL) 40 MG tablet Take 40 mg by mouth 2 (two) times daily.    [provider]  PARoxetine (PAXIL) 40 MG tablet Take by mouth. Patient not taking: Reported on 10/08/2021    [provider]  prazosin (MINIPRESS) 1 MG capsule Take 1 mg by mouth at bedtime.    [provider]  prazosin (MINIPRESS) 2 MG capsule Take by mouth. Patient not taking: Reported on 10/08/2021    [provider]    Family History Family History  Problem Relation Age of Onset   Cancer Maternal Grandmother    Mental illness Maternal Grandmother    Heart disease Maternal Grandfather    Heart disease Paternal Grandmother    Heart disease Paternal Grandfather     Social History Social History   Tobacco Use   Smoking status: Never   Smokeless tobacco: Current    Types: Snuff  Vaping Use   Vaping Use: Never used  Substance Use Topics   Alcohol use: Yes    Alcohol/week: 3.0 standard drinks of alcohol    Types: 3 Standard drinks or equivalent per week   Drug use: No     Allergies   Lurasidone and Penicillins   Review of Systems Review of Systems  Endocrine: Negative for cold intolerance.  Musculoskeletal:  Negative for joint swelling.  Skin:  Positive for wound.  All other systems reviewed and are negative.    Physical Exam Triage Vital Signs ED Triage Vitals  Enc Vitals Group     BP 10/08/21 1943 122/81     Pulse Rate 10/08/21 1943 68     Resp 10/08/21 1943 14     Temp  10/08/21 1943 98.4 F (36.9 C)     Temp Source 10/08/21 1943 Oral     SpO2 10/08/21 1943 98 %     Weight 10/08/21 1946 185 lb (83.9 kg)     Height 10/08/21 1946 5\' 5"  (1.651 m)     Head Circumference --      Peak Flow --      Pain Score 10/08/21 1946 0     Pain Loc --      Pain Edu? --      Excl. in GC? --    No data found.  Updated Vital Signs BP 122/81 (BP Location: Left Arm)   Pulse 68   Temp 98.4 F (36.9 C) (Oral)   Resp 14   Ht 5\' 5"  (1.651 m)   Wt 83.9 kg   SpO2 98%   BMI 30.79 kg/m   Visual Acuity Right Eye Distance:   Left Eye Distance:   Bilateral Distance:    Right Eye Near:   Left Eye Near:    Bilateral Near:     Physical Exam Vitals and nursing note reviewed.  Constitutional:      General: He is not in acute distress. HENT:      Head: Atraumatic.  Eyes:     Pupils: Pupils are equal, round, and reactive to light.  Cardiovascular:     Rate and Rhythm: Normal rate.  Pulmonary:     Effort: Pulmonary effort is normal.  Musculoskeletal:     Left hand: Laceration present.       Hands:     Comments: 1cm long simple superficial laceration left index finger.  Distal neurovascular function is intact.  PIP and DIP joints left second finger have full range of motion   Skin:    General: Skin is warm and dry.  Neurological:     General: No focal deficit present.     Mental Status: He is alert.      UC Treatments / Results  Labs (all labs ordered are listed, but only abnormal results are displayed) Labs Reviewed - No data to display  EKG   Radiology No results found.  Procedures Procedures Laceration Repair (Dermabond) Discussed benefits and risks of procedure and verbal consent obtained. Wound cleansed by nurse with Hibiclens.  Using sterile technique,  wound carefully inspected for debris and foreign bodies; none found.  Wound edges carefully approximated in normal anatomic position, closed with 2 Steristrips, then coated with Dermabond.  Wound precautions explained to patient.     Medications Ordered in UC Medications  Tdap (BOOSTRIX) injection 0.5 mL (0.5 mLs Intramuscular Given 10/08/21 1958)    Initial Impression / Assessment and Plan / UC Course  I have reviewed the triage vital signs and the nursing notes.  Pertinent labs & imaging results that were available during my care of the patient were reviewed by me and considered in my medical decision making (see chart for details).    Administered Tdap Call clinic if signs of infection occur.  Final Clinical Impressions(s) / UC Diagnoses   Final diagnoses:  Laceration of left index finger without foreign body without damage to nail, initial encounter     Discharge Instructions      Keep wound clean and dry.  Return for any signs of infection  (or follow-up with family doctor):  Increasing redness, swelling, pain, heat, drainage, etc. Follow instructions on Dermabond information sheet.     ED Prescriptions   None       Keylan Costabile,  Tera Mater, MD 10/09/21 [phone]

## 2021-10-08 NOTE — ED Triage Notes (Signed)
Laceration to left index finger at 1900 Bandaid in place  No active bleeding  Cleaned w/ rubbing alcohol Here w/ his wife  Tdap more than  10 years ago

## 2021-10-08 NOTE — Discharge Instructions (Signed)
Keep wound clean and dry.  Return for any signs of infection (or follow-up with family doctor):  Increasing redness, swelling, pain, heat, drainage, etc. °Follow instructions on Dermabond information sheet.  °

## 2021-10-09 ENCOUNTER — Telehealth: Payer: Self-pay | Admitting: Emergency Medicine

## 2021-10-09 NOTE — Telephone Encounter (Signed)
Call by this RN to see how adam was today - no answer - voice mail left w/ call back number for any questions or concerns

## 2021-10-23 ENCOUNTER — Ambulatory Visit
Admission: RE | Admit: 2021-10-23 | Discharge: 2021-10-23 | Disposition: A | Discharge status: Home / Self Care | Payer: Federal, State, Local not specified - PPO | Source: Ambulatory Visit | Attending: Family Medicine | Admitting: Family Medicine

## 2021-10-23 VITALS — BP 114/74 | HR 69 | Temp 99.2°F | Resp 18 | Ht 65.0 in | Wt 185.0 lb

## 2021-10-23 DIAGNOSIS — J029 Acute pharyngitis, unspecified: Secondary | ICD-10-CM | POA: Diagnosis not present

## 2021-10-23 LAB — POCT RAPID STREP A (OFFICE): Rapid Strep A Screen: NEGATIVE

## 2021-10-23 LAB — POC SARS CORONAVIRUS 2 AG -  ED: SARS Coronavirus 2 Ag: NEGATIVE

## 2021-10-23 NOTE — ED Provider Notes (Signed)
[name]    CSN: [id number] Arrival date & time: 10/23/21  1000      History   Chief Complaint Chief Complaint  Patient presents with   Sore Throat    Entered by patient   Appointment    HPI Christian [name] is a 46 y.o. male.   HPI  Patient states he has had fever and chills sore throat body aches and headache.  Vomited once.  He has been taking some over-the-counter cough and cold medicine.  No known exposure to illness, COVID or strep.  Wife at home is also sick.  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 Authorizing Provider  buPROPion (WELLBUTRIN XL) 300 MG 24 hr tablet TAKE ONE TABLET BY MOUTH IN THE MORNING FOR DEPRESSION 09/01/20  Yes [provider]  prazosin (MINIPRESS) 1 MG capsule Take 1 mg by mouth at bedtime.   Yes [provider]    Family History Family History  Problem Relation Age of Onset   Cancer Maternal Grandmother    Mental illness Maternal Grandmother    Heart disease Maternal Grandfather    Heart disease Paternal Grandmother    Heart disease Paternal Grandfather     Social History Social History   Tobacco Use   Smoking status: Never   Smokeless tobacco: Current    Types: Snuff  Vaping Use   Vaping Use: Never used  Substance Use Topics   Alcohol use: Yes    Alcohol/week: 3.0 standard drinks of alcohol    Types: 3 Standard drinks or equivalent per week   Drug use: No     Allergies   Lurasidone and Penicillins   Review of Systems Review of Systems See HPI  Physical Exam Triage Vital Signs ED Triage Vitals  Enc Vitals Group     BP 10/23/21  1107 114/74     Pulse Rate 10/23/21 1107 69     Resp 10/23/21 1107 18     Temp 10/23/21 1107 99.2 F (37.3 C)     Temp Source 10/23/21 1107 Oral     SpO2 10/23/21 1107 98 %     Weight 10/23/21 1108 185 lb (83.9 kg)     Height 10/23/21 1108 5\' 5"  (1.651 m)     Head Circumference --      Peak Flow --      Pain Score 10/23/21 1108 10     Pain Loc --      Pain Edu? --      Excl. in GC? --    No data found.  Updated Vital Signs BP 114/74 (BP Location: Right Arm)   Pulse 69   Temp 99.2 F (37.3 C) (Oral)   Resp 18   Ht 5\' 5"  (1.651 m)   Wt 83.9 kg   SpO2 98%   BMI 30.79 kg/m      Physical Exam Constitutional:      General: He is not in acute distress.    Appearance: He is well-developed.  HENT:     Head: Normocephalic and atraumatic.     Mouth/Throat:  Mouth: Mucous membranes are moist.     Pharynx: Posterior oropharyngeal erythema present.     Tonsils: No tonsillar exudate. 1+ on the right. 1+ on the left.  Eyes:     Conjunctiva/sclera: Conjunctivae normal.     Pupils: Pupils are equal, round, and reactive to light.  Cardiovascular:     Rate and Rhythm: Normal rate and regular rhythm.     Heart sounds: Normal heart sounds.  Pulmonary:     Effort: Pulmonary effort is normal. No respiratory distress.     Breath sounds: Normal breath sounds.  Abdominal:     General: There is no distension.     Palpations: Abdomen is soft.  Musculoskeletal:        General: Normal range of motion.     Cervical back: Normal range of motion.  Lymphadenopathy:     Cervical: Cervical adenopathy present.  Skin:    General: Skin is warm and dry.  Neurological:     Mental Status: He is alert.      UC Treatments / Results  Labs (all labs ordered are listed, but only abnormal results are displayed) Labs Reviewed  POCT RAPID STREP A (OFFICE)  POC SARS CORONAVIRUS 2 AG -  ED    EKG   Radiology No results found.  Procedures Procedures (including critical care  time)  Medications Ordered in UC Medications - No data to display  Initial Impression / Assessment and Plan / UC Course  I have reviewed the triage vital signs and the nursing notes.  Pertinent labs & imaging results that were available during my care of the patient were reviewed by me and considered in my medical decision making (see chart for details).     COVID test and strep test are negative.  Viral pharyngitis diagnosis.  Discussed treatment Final Clinical Impressions(s) / UC Diagnoses   Final diagnoses:  Viral pharyngitis     Discharge Instructions      Make sure you are drinking lots of fluids Take Tylenol or ibuprofen for pain and fever This is a virus that should run its course over the next few days.  Call for problems     ED Prescriptions   None    PDMP not reviewed this encounter.   Raylene Everts, MD 10/23/21 [phone]

## 2021-10-23 NOTE — Discharge Instructions (Signed)
Make sure you are drinking lots of fluids Take Tylenol or ibuprofen for pain and fever This is a virus that should run its course over the next few days.  Call for problems

## 2021-10-23 NOTE — ED Triage Notes (Signed)
Patient c/o sore throat x 4 days, body aches and chills, vomiting, headache.  Patient has taken Advil Cold & Flu and Theraflu.

## 2021-11-01 DIAGNOSIS — J028 Acute pharyngitis due to other specified organisms: Secondary | ICD-10-CM | POA: Diagnosis not present

## 2021-11-01 DIAGNOSIS — B9689 Other specified bacterial agents as the cause of diseases classified elsewhere: Secondary | ICD-10-CM | POA: Diagnosis not present

## 2021-11-01 DIAGNOSIS — J029 Acute pharyngitis, unspecified: Secondary | ICD-10-CM | POA: Diagnosis not present

## 2021-11-17 DIAGNOSIS — H938X3 Other specified disorders of ear, bilateral: Secondary | ICD-10-CM | POA: Diagnosis not present

## 2021-11-17 DIAGNOSIS — J029 Acute pharyngitis, unspecified: Secondary | ICD-10-CM | POA: Diagnosis not present

## 2022-04-30 DIAGNOSIS — H43313 Vitreous membranes and strands, bilateral: Secondary | ICD-10-CM | POA: Diagnosis not present

## 2022-04-30 DIAGNOSIS — H11153 Pinguecula, bilateral: Secondary | ICD-10-CM | POA: Diagnosis not present

## 2022-04-30 DIAGNOSIS — H35033 Hypertensive retinopathy, bilateral: Secondary | ICD-10-CM | POA: Diagnosis not present

## 2022-12-07 ENCOUNTER — Ambulatory Visit: Payer: Federal, State, Local not specified - PPO | Admitting: Family Medicine

## 2022-12-07 ENCOUNTER — Encounter: Payer: Self-pay | Admitting: Family Medicine

## 2022-12-07 VITALS — BP 117/78 | HR 72 | Ht 65.0 in | Wt 159.0 lb

## 2022-12-07 DIAGNOSIS — Z8249 Family history of ischemic heart disease and other diseases of the circulatory system: Secondary | ICD-10-CM | POA: Diagnosis not present

## 2022-12-07 DIAGNOSIS — Z832 Family history of diseases of the blood and blood-forming organs and certain disorders involving the immune mechanism: Secondary | ICD-10-CM | POA: Diagnosis not present

## 2022-12-07 DIAGNOSIS — Z13220 Encounter for screening for lipoid disorders: Secondary | ICD-10-CM | POA: Insufficient documentation

## 2022-12-07 DIAGNOSIS — R631 Polydipsia: Secondary | ICD-10-CM | POA: Insufficient documentation

## 2022-12-07 DIAGNOSIS — G43009 Migraine without aura, not intractable, without status migrainosus: Secondary | ICD-10-CM | POA: Diagnosis not present

## 2022-12-07 MED ORDER — SUMATRIPTAN SUCCINATE 25 MG PO TABS: 25.0000 mg | ORAL_TABLET | ORAL | 0 refills | Status: DC | PRN

## 2022-12-07 NOTE — Assessment & Plan Note (Signed)
Pt notes significant polydypsia and polyuria. Will get an A1c to screen for diabetes

## 2022-12-07 NOTE — Assessment & Plan Note (Signed)
With patient's headaches being unilateral and associated with light and sound sensitivity this likely sounds like migraines. Discussed trying imitrex for abortive therapy and having him keep a headache diary. If >15 per month will need neurology referral  - no red flag symptoms seen on exam today or described by patient in his history

## 2022-12-07 NOTE — Assessment & Plan Note (Signed)
Due to family hx will get lipid panel to assess for ascvd risk score and see need for statin

## 2022-12-07 NOTE — Assessment & Plan Note (Signed)
Pt's mother and father both have autoimmune conditions that include sjogrens syndrome. Pt notes some b/l hand pain. He would like to be tested for autoimmune condition today. Will go ahead and order ANA panel

## 2022-12-07 NOTE — Progress Notes (Signed)
New patient visit   Patient: Christian [name]   DOB: [birth year]   47 y.o. Male  MRN: [id number] Visit Date: 12/07/2022  Today's healthcare provider: Charlton Amor, DO   Chief Complaint  Patient presents with   New Patient (Initial Visit)    Establish care, headaches    SUBJECTIVE    Chief Complaint  Patient presents with   New Patient (Initial Visit)    Establish care, headaches   HPI HPI     New Patient (Initial Visit)    Additional comments: Establish care, headaches      Last edited by Roselyn Reef, CMA on 12/07/2022  8:26 AM.      Pt presents to establish care. Has concerns of headaches. Says they started in 2021 and he event had a CT scan done at that time that was negative. Says they occur at random times. He is unable to correlate with any triggers. He will usually take an advil and says it resolves shortly thereafter. Does admit to photophobia and phonophobia. Denies blurry vision. Denies n/v.  Has strong fam hx of autoimmune--mom and dad both have autoimmune diseases and he would like to be tested.  At the end of October he was in [place] and had to go to the hospital for severe vomiting. He has concerns about this today.  Also has strong family history of heart disease on dad's side with males having heart attacks in 55s.  Review of Systems  Constitutional:  Negative for activity change, fatigue and fever.  Respiratory:  Negative for cough and shortness of breath.   Cardiovascular:  Negative for chest pain.  Gastrointestinal:  Negative for abdominal pain.  Genitourinary:  Negative for difficulty urinating.       Current Meds  Medication Sig   SUMAtriptan (IMITREX) 25 MG tablet Take 1 tablet (25 mg total) by mouth every 2 (two) hours as needed for migraine. May repeat in 2 hours if headache persists or recurs. Do not exceed two tablets per 24hours   [DISCONTINUED] buPROPion (WELLBUTRIN XL) 300 MG 24 hr tablet TAKE ONE TABLET BY MOUTH IN THE MORNING FOR  DEPRESSION   [DISCONTINUED] prazosin (MINIPRESS) 1 MG capsule Take 1 mg by mouth at bedtime.    OBJECTIVE    BP 117/78 (BP Location: Left Arm, Patient Position: Sitting, Cuff Size: Normal)   Pulse 72   Ht 5\' 5"  (1.651 m)   Wt 159 lb (72.1 kg)   SpO2 99%   BMI 26.46 kg/m   Physical Exam Vitals and nursing note reviewed.  Constitutional:      General: He is not in acute distress.    Appearance: Normal appearance.  HENT:     Head: Normocephalic and atraumatic.     Right Ear: External ear normal.     Left Ear: External ear normal.     Nose: Nose normal.  Eyes:     Conjunctiva/sclera: Conjunctivae normal.  Cardiovascular:     Rate and Rhythm: Normal rate and regular rhythm.  Pulmonary:     Effort: Pulmonary effort is normal.     Breath sounds: Normal breath sounds.  Neurological:     General: No focal deficit present.     Mental Status: He is alert and oriented to person, place, and time.  Psychiatric:        Mood and Affect: Mood normal.        Behavior: Behavior normal.        Thought Content: Thought content normal.  Judgment: Judgment normal.        ASSESSMENT & PLAN    Problem List Items Addressed This Visit       Cardiovascular and Mediastinum   Migraine without aura and without status migrainosus, not intractable - Primary    With patient's headaches being unilateral and associated with light and sound sensitivity this likely sounds like migraines. Discussed trying imitrex for abortive therapy and having him keep a headache diary. If >15 per month will need neurology referral  - no red flag symptoms seen on exam today or described by patient in his history      Relevant Medications   SUMAtriptan (IMITREX) 25 MG tablet     Other   Screening for lipid disorders    Due to family hx will get lipid panel to assess for ascvd risk score and see need for statin      Relevant Orders   Lipid panel   Family history of autoimmune disorder    Pt's mother  and father both have autoimmune conditions that include sjogrens syndrome. Pt notes some b/l hand pain. He would like to be tested for autoimmune condition today. Will go ahead and order ANA panel      Relevant Orders   ANA,IFA RA Diag Pnl w/rflx Tit/Patn   Polydipsia    Pt notes significant polydypsia and polyuria. Will get an A1c to screen for diabetes      Relevant Orders   HgB A1c   Other Visit Diagnoses     Family history of heart disease       Relevant Orders   Lipid panel       Return in about 6 months (around 06/06/2023) for wellness visit.      Meds ordered this encounter  Medications   SUMAtriptan (IMITREX) 25 MG tablet    Sig: Take 1 tablet (25 mg total) by mouth every 2 (two) hours as needed for migraine. May repeat in 2 hours if headache persists or recurs. Do not exceed two tablets per 24hours    Dispense:  10 tablet    Refill:  0    Orders Placed This Encounter  Procedures   ANA,IFA RA Diag Pnl w/rflx Tit/Patn   Lipid panel    Order Specific Question:   Has the patient fasted?    Answer:   No    Order Specific Question:   Release to patient    Answer:   Immediate   HgB A1c     Charlton Amor, DO  [facility] at [facility] [phone] (phone) [phone] (fax)  [facility]

## 2022-12-11 LAB — LIPID PANEL
Chol/HDL Ratio: 5.1 ratio — ABNORMAL HIGH (ref 0.0–5.0)
Cholesterol, Total: 204 mg/dL — ABNORMAL HIGH (ref 100–199)
HDL: 40 mg/dL (ref >39)
LDL Chol Calc (NIH): 135 mg/dL — ABNORMAL HIGH (ref 0–99)
Triglycerides: 161 mg/dL — ABNORMAL HIGH (ref 0–149)
VLDL Cholesterol Cal: 29 mg/dL (ref 5–40)

## 2022-12-11 LAB — ANA,IFA RA DIAG PNL W/RFLX TIT/PATN
ANA Titer 1: NEGATIVE
Cyclic Citrullin Peptide Ab: 17 U (ref 0–19)
Rheumatoid fact SerPl-aCnc: <10 [IU]/mL (ref <14.0)

## 2022-12-11 LAB — HEMOGLOBIN A1C
Est. average glucose Bld gHb Est-mCnc: 94 mg/dL
Hgb A1c MFr Bld: 4.9 % (ref 4.8–5.6)

## 2022-12-15 ENCOUNTER — Ambulatory Visit: Payer: Federal, State, Local not specified - PPO | Admitting: Family Medicine

## 2023-01-10 ENCOUNTER — Other Ambulatory Visit: Payer: Self-pay | Admitting: Family Medicine

## 2023-01-10 DIAGNOSIS — G43009 Migraine without aura, not intractable, without status migrainosus: Secondary | ICD-10-CM

## 2023-02-07 ENCOUNTER — Ambulatory Visit
Admission: EM | Admit: 2023-02-07 | Discharge: 2023-02-07 | Disposition: A | Discharge status: Home / Self Care | Payer: Federal, State, Local not specified - PPO | Attending: Family Medicine | Admitting: Family Medicine

## 2023-02-07 ENCOUNTER — Ambulatory Visit: Payer: Federal, State, Local not specified - PPO | Admitting: Family Medicine

## 2023-02-07 VITALS — BP 115/84 | HR 67 | Ht 65.0 in | Wt 177.2 lb

## 2023-02-07 DIAGNOSIS — M5416 Radiculopathy, lumbar region: Secondary | ICD-10-CM | POA: Diagnosis not present

## 2023-02-07 DIAGNOSIS — S61012A Laceration without foreign body of left thumb without damage to nail, initial encounter: Secondary | ICD-10-CM | POA: Diagnosis not present

## 2023-02-07 NOTE — ED Triage Notes (Signed)
 Pt c/o laceration to LT thumb with razor blade about 415p this afternoon. Tdap up to date per pt.

## 2023-02-07 NOTE — ED Provider Notes (Signed)
 [facility]    CSN: [id number] Arrival date & time: 02/07/23  1713      History   Chief Complaint Chief Complaint  Patient presents with   Laceration    Lt thumb    HPI Christian [name] is a 48 y.o. male.   Patient lacerated his left thumb tip with a box cutter.  He had trouble getting the bleeding to stop.  He used a liquid bandage on it. Tetanus is up-to-date    Past Medical History:  Diagnosis Date   Depression    Migraines    Sleep apnea     Patient Active Problem List   Diagnosis Date Noted   Migraine without aura and without status migrainosus, not intractable 12/07/2022   Screening for lipid disorders 12/07/2022   Family history of autoimmune disorder 12/07/2022   Polydipsia 12/07/2022   Lumbar back pain with radiculopathy affecting right lower extremity 08/05/2017   Depression 03/11/2015    Past Surgical History:  Procedure Laterality Date   TONSILLECTOMY         Home Medications    Prior to Admission medications   Medication Sig Start Date End Date Taking? Authorizing Provider  SUMAtriptan  (IMITREX ) 25 MG tablet TAKE 1 TABLET (25 MG TOTAL) BY MOUTH EVERY 2 (TWO) HOURS AS NEEDED FOR MIGRAINE. MAY REPEAT IN 2 HOURS IF HEADACHE PERSISTS OR RECURS. DO NOT EXCEED TWO TABLETS PER 24HOURS 01/10/23   Bevin Bernice RAMAN, DO    Family History Family History  Problem Relation Age of Onset   Cancer Maternal Grandmother    Mental illness Maternal Grandmother    Heart disease Maternal Grandfather    Heart disease Paternal Grandmother    Heart disease Paternal Grandfather     Social History Social History   Tobacco Use   Smoking status: Never   Smokeless tobacco: Current    Types: Snuff  Vaping Use   Vaping status: Never Used  Substance Use Topics   Alcohol use: Yes    Alcohol/week: 3.0 standard drinks of alcohol    Types: 3 Standard drinks or equivalent per week   Drug use: No     Allergies   Lurasidone and Penicillins   Review  of Systems Review of Systems  See HPI Physical Exam Triage Vital Signs ED Triage Vitals [02/07/23 1741]  Encounter Vitals Group     BP 106/70     Systolic BP Percentile      Diastolic BP Percentile      Pulse Rate 64     Resp 17     Temp 98.3 F (36.8 C)     Temp Source Oral     SpO2 100 %     Weight      Height      Head Circumference      Peak Flow      Pain Score 1     Pain Loc      Pain Education      Exclude from Growth Chart    No data found.  Updated Vital Signs BP 106/70 (BP Location: Right Arm)   Pulse 64   Temp 98.3 F (36.8 C) (Oral)   Resp 17   SpO2 100%       Physical Exam Constitutional:      General: He is not in acute distress.    Appearance: He is well-developed.  HENT:     Head: Normocephalic and atraumatic.  Eyes:     Conjunctiva/sclera: Conjunctivae normal.  Pupils: Pupils are equal, round, and reactive to light.  Cardiovascular:     Rate and Rhythm: Normal rate.  Pulmonary:     Effort: Pulmonary effort is normal. No respiratory distress.  Abdominal:     General: There is no distension.     Palpations: Abdomen is soft.  Musculoskeletal:        General: Normal range of motion.     Cervical back: Normal range of motion.  Skin:    General: Skin is warm and dry.     Comments: 2-1/2 cm laceration to left tip of thumb.  Does not involve nail  Neurological:     Mental Status: He is alert.      UC Treatments / Results  Labs (all labs ordered are listed, but only abnormal results are displayed) Labs Reviewed - No data to display  EKG   Radiology No results found.  Procedures Laceration Repair  Date/Time: 02/07/2023 6:43 PM  Performed by: Maranda Jamee Jacob, MD Authorized by: Maranda Jamee Jacob, MD   Consent:    Consent obtained:  Verbal   Consent given by:  Patient   Alternatives discussed:  No treatment Universal protocol:    Patient identity confirmed:  Verbally with patient and arm band Anesthesia:     Anesthesia method:  Local infiltration   Local anesthetic:  Lidocaine 1% w/o epi Laceration details:    Location:  Finger   Finger location:  L thumb   Length (cm):  2.5   Depth (mm):  5 Pre-procedure details:    Preparation:  Patient was prepped and draped in usual sterile fashion Exploration:    Wound exploration: wound explored through full range of motion   Treatment:    Area cleansed with:  Chlorhexidine   Amount of cleaning:  Standard   Debridement:  None   Undermining:  None Skin repair:    Repair method:  Sutures   Suture size:  4-0   Suture material:  Prolene   Suture technique:  Simple interrupted   Number of sutures:  2 Approximation:    Approximation:  Close Repair type:    Repair type:  Simple Post-procedure details:    Dressing:  Antibiotic ointment   Procedure completion:  Tolerated  (including critical care time)  Medications Ordered in UC Medications - No data to display  Initial Impression / Assessment and Plan / UC Course  I have reviewed the triage vital signs and the nursing notes.  Pertinent labs & imaging results that were available during my care of the patient were reviewed by me and considered in my medical decision making (see chart for details).     Wound care discussed.  Suture removal discussed Final Clinical Impressions(s) / UC Diagnoses   Final diagnoses:  Laceration of left thumb without foreign body without damage to nail, initial encounter     Discharge Instructions      Try to keep clean and reasonably dry Watch for infection Stitches out in 7-10 days   ED Prescriptions   None    PDMP not reviewed this encounter.   Maranda Jamee Jacob, MD 02/07/23 [phone]

## 2023-02-07 NOTE — Discharge Instructions (Signed)
 Try to keep clean and reasonably dry Watch for infection Stitches out in 7-10 days

## 2023-02-07 NOTE — Assessment & Plan Note (Signed)
 Pt presents with numbness and tingling above ankle. Likely believe this is related to disc herniation as distribution of nerve pain was in the L4 area and pt notes herniation of L4/L5 disc - have referred to PT and encouraged pt to make an apt with Dr. ONEIDA since he says he does not see anyone for his back

## 2023-02-07 NOTE — Progress Notes (Signed)
   Acute Office Visit  Subjective:     Patient ID: Christian [name], male    DOB: 1975/02/21, 48 y.o.   MRN: [id number]  Chief Complaint  Patient presents with   Ankle Pain    Right ankle    HPI Patient is in today for concerns of tingling near his ankle. He said it started a few weeks ago and was persistent for a few days and now has subsided. He does have a hx of back pain with disc herniation.   Review of Systems  Constitutional:  Negative for chills and fever.  Respiratory:  Negative for cough and shortness of breath.   Cardiovascular:  Negative for chest pain.  Neurological:  Negative for headaches.        Objective:    BP 115/84 (BP Location: Left Arm, Patient Position: Sitting, Cuff Size: Normal)   Pulse 67   Ht 5' 5 (1.651 m)   Wt 177 lb 4 oz (80.4 kg)   SpO2 98%   BMI 29.50 kg/m    Physical Exam Vitals and nursing note reviewed.  Constitutional:      General: He is not in acute distress.    Appearance: Normal appearance.  HENT:     Head: Normocephalic and atraumatic.     Right Ear: External ear normal.     Left Ear: External ear normal.     Nose: Nose normal.  Eyes:     Conjunctiva/sclera: Conjunctivae normal.  Cardiovascular:     Rate and Rhythm: Normal rate.  Pulmonary:     Effort: Pulmonary effort is normal.  Musculoskeletal:     Comments: Notes radiculopathy present medial ankle on right  Neurological:     General: No focal deficit present.     Mental Status: He is alert and oriented to person, place, and time.  Psychiatric:        Mood and Affect: Mood normal.        Behavior: Behavior normal.        Thought Content: Thought content normal.        Judgment: Judgment normal.     No results found for any visits on 02/07/23.      Assessment & Plan:   Problem List Items Addressed This Visit       Nervous and Auditory   Lumbar back pain with radiculopathy affecting right lower extremity - Primary   Pt presents with numbness and tingling  above ankle. Likely believe this is related to disc herniation as distribution of nerve pain was in the L4 area and pt notes herniation of L4/L5 disc - have referred to PT and encouraged pt to make an apt with Dr. ONEIDA since he says he does not see anyone for his back       Relevant Orders   Ambulatory referral to Physical Therapy    No orders of the defined types were placed in this encounter.   No follow-ups on file.  Bernice GORMAN [place], DO

## 2023-02-09 ENCOUNTER — Encounter: Payer: Self-pay | Admitting: Sports Medicine

## 2023-02-09 ENCOUNTER — Ambulatory Visit: Payer: Federal, State, Local not specified - PPO | Admitting: Sports Medicine

## 2023-02-09 ENCOUNTER — Ambulatory Visit: Payer: Federal, State, Local not specified - PPO

## 2023-02-09 DIAGNOSIS — M4804 Spinal stenosis, thoracic region: Secondary | ICD-10-CM | POA: Diagnosis not present

## 2023-02-09 DIAGNOSIS — M5416 Radiculopathy, lumbar region: Secondary | ICD-10-CM

## 2023-02-09 DIAGNOSIS — M79641 Pain in right hand: Secondary | ICD-10-CM | POA: Diagnosis not present

## 2023-02-09 DIAGNOSIS — M48061 Spinal stenosis, lumbar region without neurogenic claudication: Secondary | ICD-10-CM | POA: Diagnosis not present

## 2023-02-09 DIAGNOSIS — R252 Cramp and spasm: Secondary | ICD-10-CM | POA: Diagnosis not present

## 2023-02-09 DIAGNOSIS — M79642 Pain in left hand: Secondary | ICD-10-CM | POA: Diagnosis not present

## 2023-02-09 DIAGNOSIS — M545 Low back pain, unspecified: Secondary | ICD-10-CM | POA: Diagnosis not present

## 2023-02-09 DIAGNOSIS — M47817 Spondylosis without myelopathy or radiculopathy, lumbosacral region: Secondary | ICD-10-CM | POA: Diagnosis not present

## 2023-02-09 MED ORDER — GABAPENTIN 300 MG PO CAPS: ORAL_CAPSULE | ORAL | 3 refills | Status: AC

## 2023-02-09 MED ORDER — PREDNISONE 50 MG PO TABS: ORAL_TABLET | ORAL | 0 refills | Status: AC

## 2023-02-09 NOTE — Assessment & Plan Note (Signed)
Also endorsing cramping of both hands particularly with repetitive motion. He does not have any nocturnal symptoms, no paresthesias or numbness tingling. No trauma, no mechanical symptoms. He was tested for lupus and rheumatoid arthritis, this was normal. No family history of muscular or myotonic dystrophy though he does have a strong family history of autoimmune disease. Examination is normal today. We will hold off on medication and imaging for now, however if still having symptoms we will need to test his electrolytes, as well as consider nerve conduction/EMG. 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

## 2023-02-09 NOTE — Progress Notes (Signed)
    Procedures performed today:    None.  Independent interpretation of notes and tests performed by another provider:   None.  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 He did have an MRI done about 5-6 years ago that showed L4-L5 DDD. He initially went to [facility] orthopedics, it sounds like he was diagnosed with myofascial type back pain, he had some trigger point injections which she reported did not help. I explained him that trigger point injections are useful for the muscular component of his pain but if his pain was discogenic I would not expect trigger point injections to help. He did see his PCP who suggested physical therapy, he was also referred to me, he has not yet started PT. I explained the anatomy and pathophysiology behind lumbar disc disease, he also understands the evolutionary anthropology. He understands the need for formal physical therapy, we will also add 5 days of prednisone, Neurontin for use as needed. Return to see me in 6 weeks, MR for interventional planning if not better.   Cramping of hands Also endorsing cramping of both hands particularly with repetitive motion. He does not have any nocturnal symptoms, no paresthesias or numbness tingling. No trauma, no mechanical symptoms. He was tested for lupus and rheumatoid arthritis, this was normal. No family history of muscular or myotonic dystrophy though he does have a strong family history of autoimmune disease. Examination is normal today. We will hold off on medication and imaging for now, however if still having symptoms we will need to test his electrolytes, as well as consider nerve conduction/EMG. I do  suspect this is hand osteoarthritis.  Chronic process with exacerbation and pharmacologic intervention  ____________________________________________ Christian [name]. Christian [name], M.D., ABFM., CAQSM., AME. Primary Care and Sports Medicine [facility]  Adjunct Professor of Family Medicine  [employer]  Restaurant manager, fast food

## 2023-02-09 NOTE — Assessment & Plan Note (Addendum)
This is an 48 year old male, long history of axial low back pain with occasional radiation down the legs, more recently he has had radiation down the right leg to the medial right ankle consistent with an L4 distribution. He did have an MRI done about 5-6 years ago that showed L4-L5 DDD. He initially went to [facility] orthopedics, it sounds like he was diagnosed with myofascial type back pain, he had some trigger point injections which she reported did not help. I explained him that trigger point injections are useful for the muscular component of his pain but if his pain was discogenic I would not expect trigger point injections to help. He did see his PCP who suggested physical therapy, he was also referred to me, he has not yet started PT. I explained the anatomy and pathophysiology behind lumbar disc disease, he also understands the evolutionary anthropology. He understands the need for formal physical therapy, we will also add 5 days of prednisone, Neurontin for use as needed. Return to see me in 6 weeks, MR for interventional planning if not better.

## 2023-02-25 ENCOUNTER — Other Ambulatory Visit: Payer: Self-pay | Admitting: Family Medicine

## 2023-02-25 DIAGNOSIS — G43009 Migraine without aura, not intractable, without status migrainosus: Secondary | ICD-10-CM

## 2023-03-23 ENCOUNTER — Ambulatory Visit: Payer: Federal, State, Local not specified - PPO | Admitting: Sports Medicine

## 2023-06-01 ENCOUNTER — Encounter: Payer: Self-pay | Admitting: Family Medicine

## 2023-06-21 ENCOUNTER — Encounter: Payer: Federal, State, Local not specified - PPO | Admitting: Family Medicine

## 2023-09-26 ENCOUNTER — Encounter: Payer: Self-pay | Admitting: Sports Medicine
